# Patient Record
Sex: Female | Born: 1965 | Hispanic: No
Health system: Southern US, Community
[De-identification: ages and names within clinical notes are randomized; demographics above are authoritative.]

---

## 2006-12-31 ENCOUNTER — Other Ambulatory Visit: Admission: RE | Admit: 2006-12-31 | Discharge: 2006-12-31 | Payer: Self-pay | Admitting: Gynecology

## 2007-10-04 ENCOUNTER — Other Ambulatory Visit: Admission: RE | Admit: 2007-10-04 | Discharge: 2007-10-04 | Payer: Self-pay | Admitting: Family Medicine

## 2014-10-23 ENCOUNTER — Other Ambulatory Visit (HOSPITAL_COMMUNITY)
Admission: RE | Admit: 2014-10-23 | Discharge: 2014-10-23 | Disposition: A | Discharge status: Home / Self Care | Payer: BLUE CROSS/BLUE SHIELD | Source: Ambulatory Visit | Attending: Family Medicine | Admitting: Family Medicine

## 2014-10-23 ENCOUNTER — Other Ambulatory Visit: Payer: Self-pay | Admitting: Family Medicine

## 2014-10-23 DIAGNOSIS — Z124 Encounter for screening for malignant neoplasm of cervix: Secondary | ICD-10-CM | POA: Diagnosis not present

## 2014-10-27 LAB — CYTOLOGY - PAP

## 2019-01-29 ENCOUNTER — Other Ambulatory Visit: Payer: Self-pay | Admitting: Physician Assistant

## 2019-01-29 DIAGNOSIS — N6310 Unspecified lump in the right breast, unspecified quadrant: Secondary | ICD-10-CM

## 2019-01-29 DIAGNOSIS — N63 Unspecified lump in unspecified breast: Secondary | ICD-10-CM | POA: Diagnosis not present

## 2019-01-30 ENCOUNTER — Ambulatory Visit
Admission: RE | Admit: 2019-01-30 | Discharge: 2019-01-30 | Disposition: A | Discharge status: Home / Self Care | Payer: Managed Care, Other (non HMO) | Source: Ambulatory Visit | Attending: Physician Assistant | Admitting: Physician Assistant

## 2019-01-30 ENCOUNTER — Other Ambulatory Visit: Payer: Self-pay

## 2019-01-30 ENCOUNTER — Ambulatory Visit
Admission: RE | Admit: 2019-01-30 | Discharge: 2019-01-30 | Disposition: A | Discharge status: Home / Self Care | Payer: BLUE CROSS/BLUE SHIELD | Source: Ambulatory Visit | Attending: Physician Assistant | Admitting: Physician Assistant

## 2019-01-30 ENCOUNTER — Other Ambulatory Visit: Payer: Self-pay | Admitting: Physician Assistant

## 2019-01-30 DIAGNOSIS — R922 Inconclusive mammogram: Secondary | ICD-10-CM | POA: Diagnosis not present

## 2019-01-30 DIAGNOSIS — N6312 Unspecified lump in the right breast, upper inner quadrant: Secondary | ICD-10-CM | POA: Diagnosis not present

## 2019-01-30 DIAGNOSIS — N6311 Unspecified lump in the right breast, upper outer quadrant: Secondary | ICD-10-CM | POA: Diagnosis not present

## 2019-01-30 DIAGNOSIS — N6310 Unspecified lump in the right breast, unspecified quadrant: Secondary | ICD-10-CM

## 2019-01-30 DIAGNOSIS — N6001 Solitary cyst of right breast: Secondary | ICD-10-CM | POA: Diagnosis not present

## 2019-03-12 ENCOUNTER — Other Ambulatory Visit (HOSPITAL_COMMUNITY)
Admission: RE | Admit: 2019-03-12 | Discharge: 2019-03-12 | Disposition: A | Discharge status: Home / Self Care | Payer: BC Managed Care – PPO | Source: Ambulatory Visit | Attending: Physician Assistant | Admitting: Physician Assistant

## 2019-03-12 ENCOUNTER — Other Ambulatory Visit: Payer: Self-pay | Admitting: Physician Assistant

## 2019-03-12 DIAGNOSIS — Z1211 Encounter for screening for malignant neoplasm of colon: Secondary | ICD-10-CM | POA: Diagnosis not present

## 2019-03-12 DIAGNOSIS — Z724 Inappropriate diet and eating habits: Secondary | ICD-10-CM | POA: Diagnosis not present

## 2019-03-12 DIAGNOSIS — Z13220 Encounter for screening for lipoid disorders: Secondary | ICD-10-CM | POA: Diagnosis not present

## 2019-03-12 DIAGNOSIS — R102 Pelvic and perineal pain: Secondary | ICD-10-CM | POA: Diagnosis not present

## 2019-03-12 DIAGNOSIS — Z124 Encounter for screening for malignant neoplasm of cervix: Secondary | ICD-10-CM | POA: Insufficient documentation

## 2019-03-12 DIAGNOSIS — N930 Postcoital and contact bleeding: Secondary | ICD-10-CM | POA: Diagnosis not present

## 2019-03-12 DIAGNOSIS — Z0000 Encounter for general adult medical examination without abnormal findings: Secondary | ICD-10-CM | POA: Diagnosis not present

## 2019-03-12 DIAGNOSIS — R1900 Intra-abdominal and pelvic swelling, mass and lump, unspecified site: Secondary | ICD-10-CM | POA: Diagnosis not present

## 2019-03-12 DIAGNOSIS — Z23 Encounter for immunization: Secondary | ICD-10-CM | POA: Diagnosis not present

## 2019-03-14 ENCOUNTER — Other Ambulatory Visit: Payer: Self-pay | Admitting: Physician Assistant

## 2019-03-14 DIAGNOSIS — R102 Pelvic and perineal pain: Secondary | ICD-10-CM

## 2019-03-14 DIAGNOSIS — R1900 Intra-abdominal and pelvic swelling, mass and lump, unspecified site: Secondary | ICD-10-CM

## 2019-03-14 LAB — CYTOLOGY - PAP
Chlamydia: NEGATIVE
Diagnosis: NEGATIVE
HPV: NOT DETECTED
Neisseria Gonorrhea: NEGATIVE
Trichomonas: NEGATIVE

## 2019-03-31 ENCOUNTER — Ambulatory Visit
Admission: RE | Admit: 2019-03-31 | Discharge: 2019-03-31 | Disposition: A | Discharge status: Home / Self Care | Payer: BC Managed Care – PPO | Source: Ambulatory Visit | Attending: Physician Assistant | Admitting: Physician Assistant

## 2019-03-31 DIAGNOSIS — R102 Pelvic and perineal pain: Secondary | ICD-10-CM

## 2019-03-31 DIAGNOSIS — R1909 Other intra-abdominal and pelvic swelling, mass and lump: Secondary | ICD-10-CM | POA: Diagnosis not present

## 2019-03-31 DIAGNOSIS — R1900 Intra-abdominal and pelvic swelling, mass and lump, unspecified site: Secondary | ICD-10-CM

## 2019-04-23 ENCOUNTER — Ambulatory Visit: Payer: Managed Care, Other (non HMO) | Admitting: Registered"

## 2019-12-16 DIAGNOSIS — M7660 Achilles tendinitis, unspecified leg: Secondary | ICD-10-CM | POA: Diagnosis not present

## 2020-01-09 ENCOUNTER — Ambulatory Visit (INDEPENDENT_AMBULATORY_CARE_PROVIDER_SITE_OTHER): Payer: BC Managed Care – PPO | Admitting: Family Medicine

## 2020-01-09 ENCOUNTER — Ambulatory Visit: Payer: Self-pay

## 2020-01-09 ENCOUNTER — Other Ambulatory Visit: Payer: Self-pay

## 2020-01-09 VITALS — BP 130/80 | HR 60 | Ht 63.0 in | Wt 146.4 lb

## 2020-01-09 DIAGNOSIS — E65 Localized adiposity: Secondary | ICD-10-CM | POA: Diagnosis not present

## 2020-01-09 DIAGNOSIS — S86311A Strain of muscle(s) and tendon(s) of peroneal muscle group at lower leg level, right leg, initial encounter: Secondary | ICD-10-CM | POA: Diagnosis not present

## 2020-01-09 DIAGNOSIS — M79672 Pain in left foot: Secondary | ICD-10-CM | POA: Diagnosis not present

## 2020-01-09 DIAGNOSIS — S86319A Strain of muscle(s) and tendon(s) of peroneal muscle group at lower leg level, unspecified leg, initial encounter: Secondary | ICD-10-CM | POA: Insufficient documentation

## 2020-01-09 MED ORDER — NITROGLYCERIN 0.2 MG/HR TD PT24: MEDICATED_PATCH | TRANSDERMAL | 1 refills | Status: AC

## 2020-01-09 NOTE — Patient Instructions (Addendum)
Thank you for coming in today. Use gel heel pads or insoles in your running shoes for normal activity.  Use the compression sleeve.  Do the exercises.  Go from up to down slowly. It should take 3-4 seconds.  10-30 reps 2-3x daily.   I recommend you obtained a compression sleeve to help with your joint problems. There are many options on the market however I recommend obtaining a Full Ankle  Body Helix compression sleeve.  You can find information (including how to appropriate measure yourself for sizing) can be found at www.[employer].ch.  Many of these products are health savings account (HSA) eligible.   You can use the compression sleeve at any time throughout the day but is most important to use while being active as well as for 2 hours post-activity.   It is appropriate to ice following activity with the compression sleeve in place.  Nitroglycerin Protocol   Apply 1/4 nitroglycerin patch to affected area daily.  Change position of patch within the affected area every 24 hours.  You may experience a headache during the first 1-2 weeks of using the patch, these should subside.  If you experience headaches after beginning nitroglycerin patch treatment, you may take your preferred over the counter pain reliever.  Another side effect of the nitroglycerin patch is skin irritation or rash related to patch adhesive.  Please notify our office if you develop more severe headaches or rash, and stop the patch.  Tendon healing with nitroglycerin patch may require 12 to 24 weeks depending on the extent of injury.  Men should not use if taking Viagra, Cialis, or Levitra.   Do not use if you have migraines or rosacea.    Peroneal Tendinopathy Rehab Ask your health care provider which exercises are safe for you. Do exercises exactly as told by your health care provider and adjust them as directed. It is normal to feel mild stretching, pulling, tightness, or discomfort as you do these  exercises. Stop right away if you feel sudden pain or your pain gets worse. Do not begin these exercises until told by your health care provider. Stretching and range-of-motion exercises These exercises warm up your muscles and joints and improve the movement and flexibility of your ankle. These exercises also help to relieve pain and stiffness. Gastroc and soleus stretch, standing This is an exercise in which you stand on a step and use your body weight to stretch your calf muscles. To do this exercise: 1. Stand on the edge of a step on the ball of your left / right foot. The ball of your foot is on the walking surface, right under your toes. 2. Keep your other foot firmly on the same step. 3. Hold on to the wall, a railing, or a chair for balance. 4. Slowly lift your other foot, allowing your body weight to press your left / right heel down over the edge of the step. You should feel a stretch in your left / right calf (gastrocnemius and soleus). 5. Hold this position for __________ seconds. 6. Return both feet to the step. 7. Repeat this exercise with a slight bend in your left / right knee. Repeat __________ times with your left / right knee straight and __________ times with your left / right knee bent. Complete this exercise __________ times a day. Strengthening exercises These exercises build strength and endurance in your foot and ankle. Endurance is the ability to use your muscles for a long time, even after they  get tired. Ankle dorsiflexion with band  1. Secure a rubber exercise band or tube to an object, such as a table leg, that will not move when the band is pulled. 2. Secure the other end of the band around your left / right foot. 3. Sit on the floor, facing the object with your left / right leg extended. The band or tube should be slightly tense when your foot is relaxed. 4. Slowly flex your left / right ankle and toes to bring your foot toward you (dorsiflexion). 5. Hold this  position for __________ seconds. 6. Let the band or tube slowly pull your foot back to the starting position. Repeat __________ times. Complete this exercise __________ times a day. Ankle eversion 1. Sit on the floor with your legs straight out in front of you. 2. Loop a rubber exercise band or tube around the ball of your left / right foot. The ball of your foot is on the walking surface, right under your toes. 3. Hold the ends of the band in your hands, or secure the band to a stable object. The band or tube should be slightly tense when your foot is relaxed. 4. Slowly push your foot outward, away from your other leg (eversion). 5. Hold this position for __________ seconds. 6. Slowly return your foot to the starting position. Repeat __________ times. Complete this exercise __________ times a day. Plantar flexion, standing This exercise is sometimes called standing heel raise. 1. Stand with your feet shoulder-width apart. 2. Place your hands on a wall or table to steady yourself as needed, but try not to use it for support. 3. Keep your weight spread evenly over the width of your feet while you slowly rise up on your toes (plantar flexion). If told by your health care provider: ? Shift your weight toward your left / right leg until you feel challenged. ? Stand on your left / right leg only. 4. Hold this position for __________ seconds. Repeat __________ times. Complete this exercise __________ times a day. Single leg stand 1. Without shoes, stand near a railing or in a doorway. You may hold on to the railing or door frame as needed. 2. Stand on your left / right foot. Keep your big toe down on the floor and try to keep your arch lifted. ? Do not roll to the outside of your foot. ? If this exercise is too easy, you can try it with your eyes closed or while standing on a pillow. 3. Hold this position for __________ seconds. Repeat __________ times. Complete this exercise __________ times a  day. This information is not intended to replace advice given to you by your health care provider. Make sure you discuss any questions you have with your health care provider. Document Revised: 10/22/2018 Document Reviewed: 10/22/2018 Elsevier Patient Education  [place].

## 2020-01-09 NOTE — Progress Notes (Signed)
    Subjective:    CC: L Achille's tendon pain  I, Suzanne Lee, LAT, ATC, am serving as scribe for Dr. Clementeen Graham.  HPI: Pt is a 54 y/o female presenting w/ c/o lateral ankle tendon pain x approximately one month w/ no known MOI.  Pt states that she is an avid runner and has not been running due to the pain.  She states that she does recall stepping into a hole in her yard and that may be related.  She also notes pain at the plantar aspect of her calcaneus near the midpoint between Achilles tendon and plantar fascia.  Radiating pain: from the proximal calcaneus to the peroneal tendon Achille's tendon swelling: slightly between L lateral malleolus and Achille's Aggravating factors: walking/weight-bearing; L ankle inversion Treatments tried: RICE; lidocaine patch  Pertinent review of Systems: No fevers or chills  Relevant historical information: No migraines   Objective:    Vitals:   01/09/20 0829  BP: 130/80  Pulse: 60  SpO2: 99%   General: Well Developed, well nourished, and in no acute distress.   MSK: Left ankle and foot normal-appearing Normal motion. Palpable click/snap at lateral malleolus with ankle motion occasionally. Mildly tender palpation of posterior aspect of lateral malleolus.  Mildly tender palpation posterior plantar calcaneus. Stable ligamentous exam. Intact strength. Pulses cap refill and sensation are intact distally.  Lab and Radiology Results  Diagnostic Limited MSK Ultrasound of: Left lateral ankle and plantar calcaneus Achilles tendon intact normal-appearing Peroneal tendons visible.  With dynamic motion peroneal longus compresses and flattens the peroneal brevis tendon indicating probable split tear peroneal brevis tendon. Tendon fascicles appear to be intact without significant transverse tearing. Mild hypoechoic fluid present in tendon sheath. Plantar calcaneus visible.  Hypoechoic fluid tracking interspaced in fat pad compressible  indicating fat pad edema. Bony cortex and plantar fashion normal-appearing Impression: Linear split tear peroneal brevis tendon and heel pad inflammation    Impression and Recommendations:    Assessment and Plan: 55 y.o. female with  Left lateral ankle pain.  Probable linear split tear peroneal brevis tendon based on dynamic imaging.  Plan for nitroglycerin patches home exercise program and compressive ankle sleeve.  Recheck back in about 1 month especially if not improving.  Next steps would be either injection or MRI.Marland Kitchen  Plantar calcaneal pain: Fat pad inflammation.  Plan for cushioning and relative rest.  Recheck in a month.   Orders Placed This Encounter  Procedures  . [place] LIMITED JOINT SPACE STRUCTURES LOW LEFT(NO LINKED CHARGES)    Order Specific Question:   Reason for Exam (SYMPTOM  OR DIAGNOSIS REQUIRED)    Answer:   left heel pain    Order Specific Question:   Preferred imaging location?    Answer:   [facility] ordered this encounter  Medications  . nitroGLYCERIN (NITRODUR - DOSED IN MG/24 HR) 0.2 mg/hr patch    Sig: Apply 1/4 patch daily to tendon for tendonitis.    Dispense:  30 patch    Refill:  1    Discussed warning signs or symptoms. Please see discharge instructions. Patient expresses understanding.   The above documentation has been reviewed and is accurate and complete Clementeen Graham, M.D.

## 2020-02-16 ENCOUNTER — Other Ambulatory Visit: Payer: Self-pay

## 2020-02-16 ENCOUNTER — Ambulatory Visit (INDEPENDENT_AMBULATORY_CARE_PROVIDER_SITE_OTHER): Payer: BC Managed Care – PPO | Admitting: Family Medicine

## 2020-02-16 VITALS — BP 140/82 | HR 72 | Ht 63.0 in | Wt 146.8 lb

## 2020-02-16 DIAGNOSIS — S86312D Strain of muscle(s) and tendon(s) of peroneal muscle group at lower leg level, left leg, subsequent encounter: Secondary | ICD-10-CM | POA: Diagnosis not present

## 2020-02-16 NOTE — Progress Notes (Signed)
   I, Christoper Fabian, LAT, ATC, am serving as scribe for Dr. Clementeen Graham.  Suzanne Lee is a 54 y.o. female who presents to [facility] today for f/u of L Achille's pain.  She was last seen by Dr. Denyse Amass on 01/09/20 and was prescribed Nitro patches and was shown Alfredson's exercises by Dr. Denyse Amass.  She was also advised to wear a compression sleeve and to use cushioning inserts in her shoes.  Since her last visit, pt reports that her pain has become more intermittent and notes decreased popping in her ankle / Achille's.  She states that she has not run since her last visit.  She has been using the nitroglycerin patches and doing her HEP.   Pertinent review of systems: No fevers or chills  Relevant historical information: Reasonably healthy overall.   Exam:  BP 140/82 (BP Location: Right Arm, Patient Position: Sitting, Cuff Size: Normal)   Pulse 72   Ht 5\' 3"  (1.6 m)   Wt 146 lb 12.8 oz (66.6 kg)   SpO2 97%   BMI 26.00 kg/m  General: Well Developed, well nourished, and in no acute distress.   MSK: Left ankle normal-appearing nontender normal motion normal strength.       Assessment and Plan: 54 y.o. female with left lateral ankle pain.  Patient had tear of peroneal brevis tendon on ultrasound at last visit.  Clinically she has improved considerably with home exercise program and nitroglycerin patch protocol.  Recommend continuing both for another 2 months.  Recommend starting return to activity program at about 50% preinjury workload and advancing 10 %/week.  Recheck back with me as needed.   Discussed warning signs or symptoms. Please see discharge instructions. Patient expresses understanding.   The above documentation has been reviewed and is accurate and complete 53, M.D.  Total encounter time 20 minutes including charting time date of service.

## 2020-02-16 NOTE — Patient Instructions (Signed)
Thank you for coming in today. Plan for exercises.  Continue nitro-patches.  Advance activity.  Start at 50% pre-injury workload and advance by about 10% per week.  Recheck with me as needed.

## 2020-05-25 IMAGING — MG DIGITAL DIAGNOSTIC BILATERAL MAMMOGRAM WITH TOMO AND CAD
5 of 10 series · 5 of 30 positions shown · non-contrast
Comparison: None.
COMPARISON: None.

Addendum:
CLINICAL DATA: Patient presents with a palpable lump in the upper
right breast, which is also tender.

EXAM:
DIGITAL DIAGNOSTIC BILATERAL MAMMOGRAM WITH CAD AND TOMO
ULTRASOUND LEFT BREAST

[R MLO synth-2D (1 of 2)]
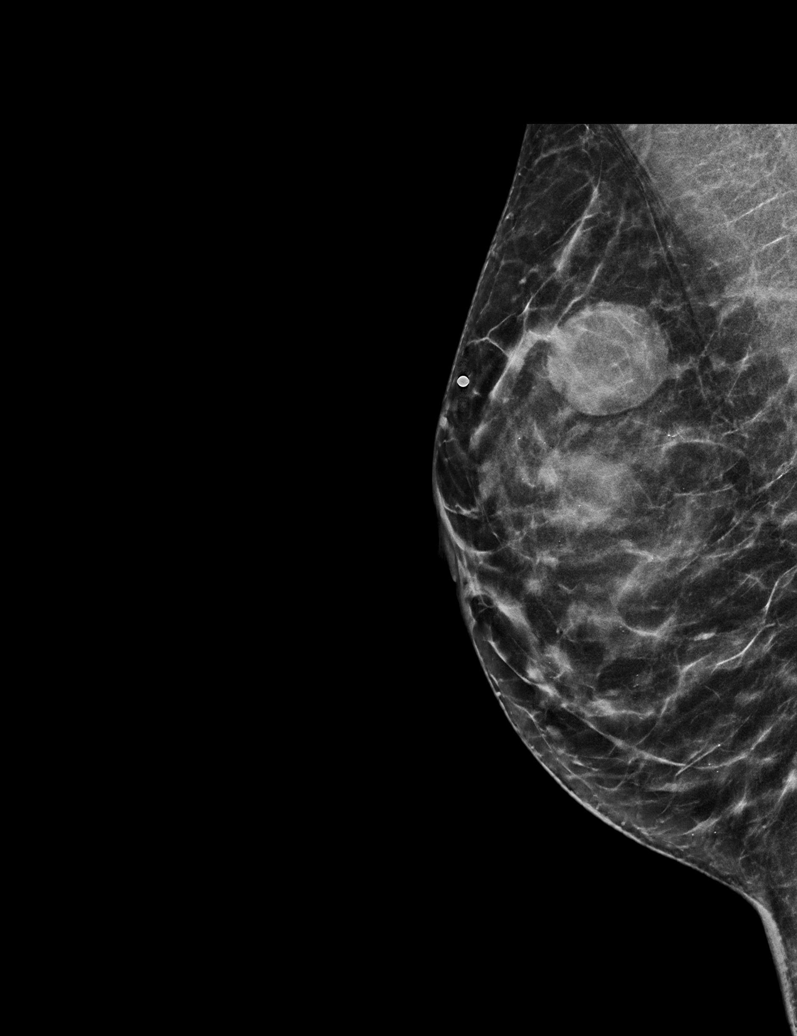

[R CC synth-2D]
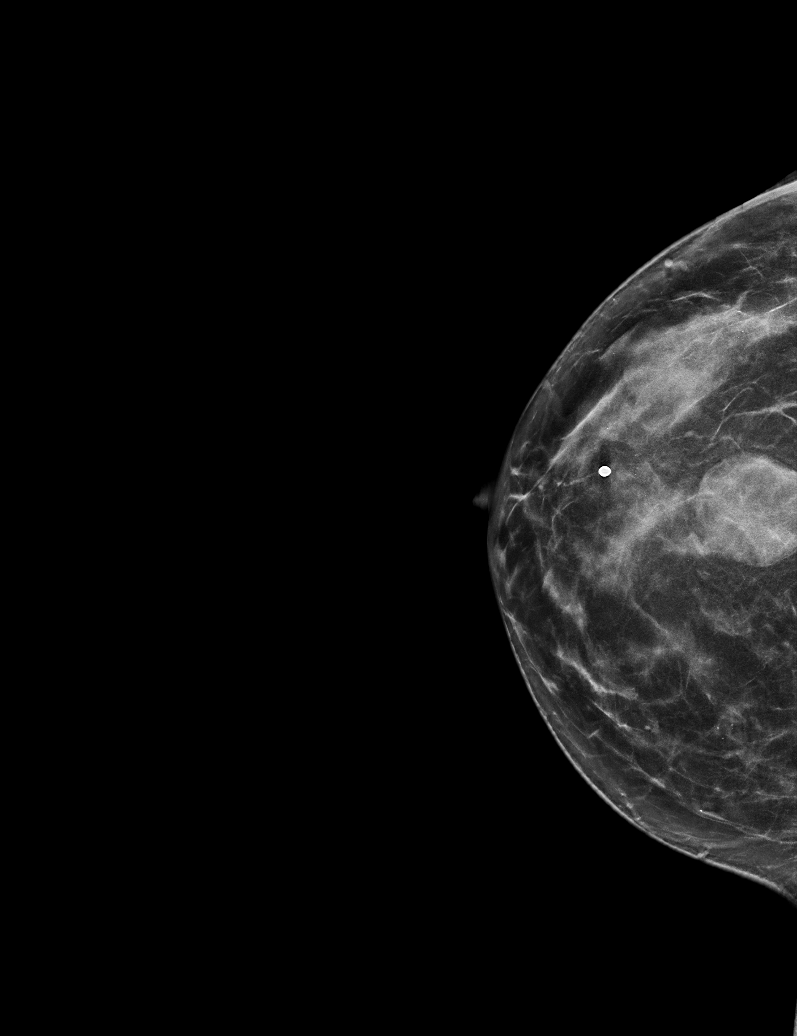

[R MLO synth-2D (2 of 2)]
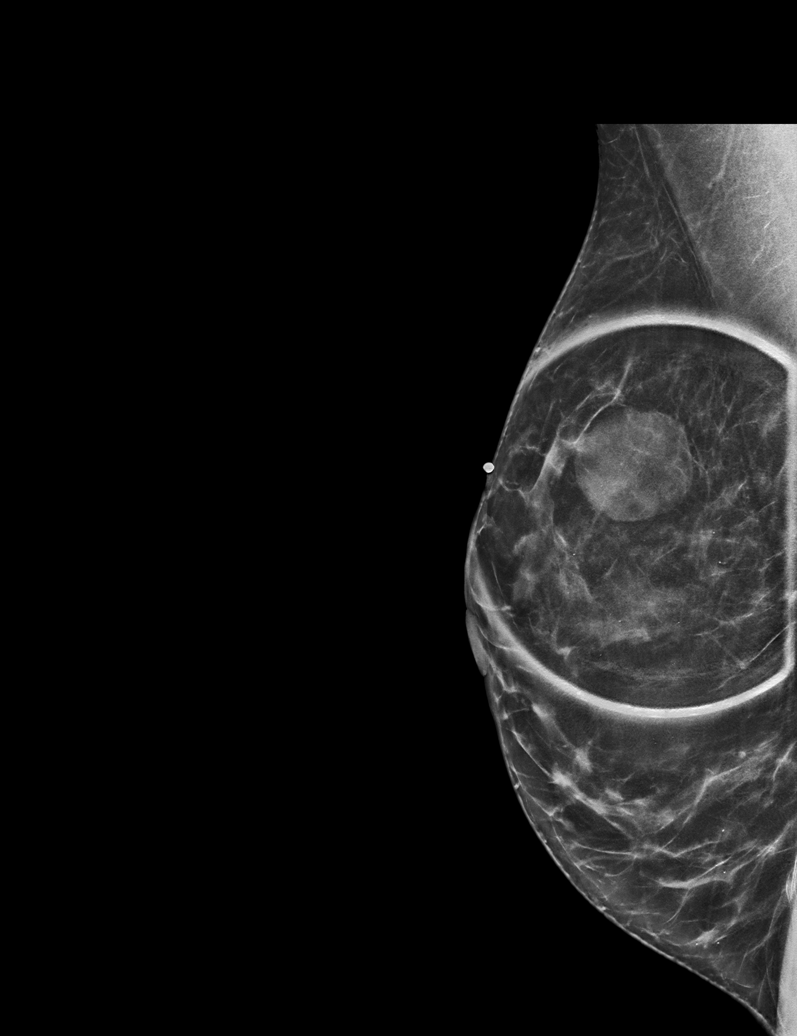

[L MLO synth-2D]
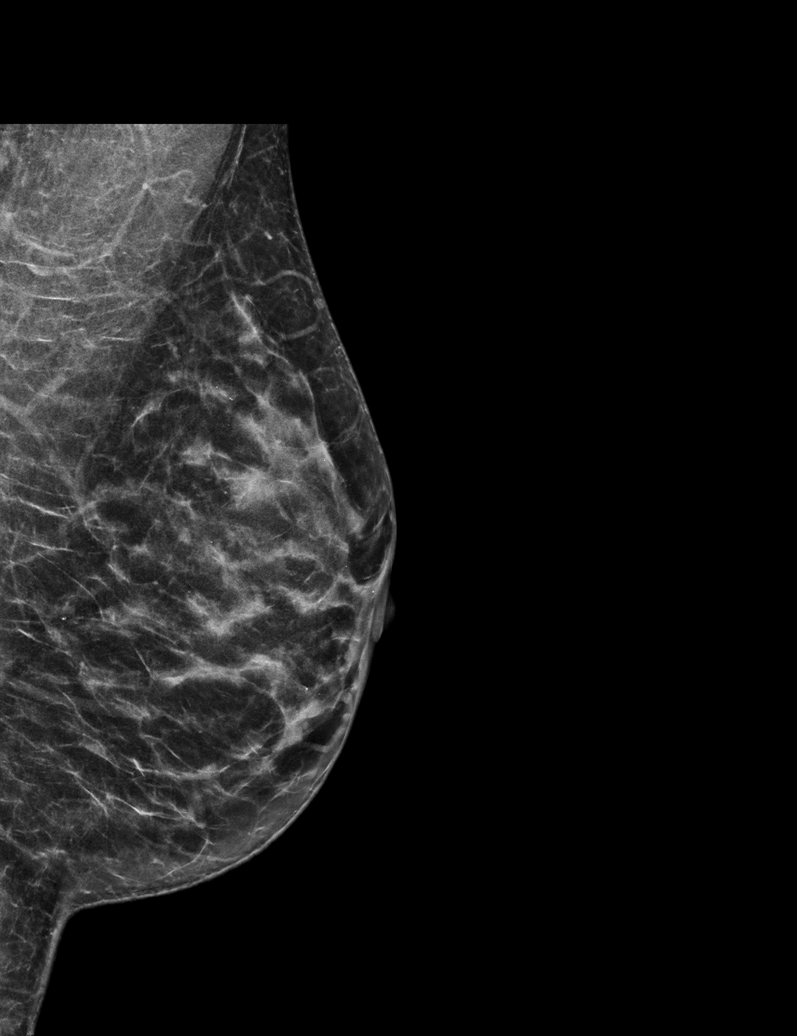

[L CC synth-2D]
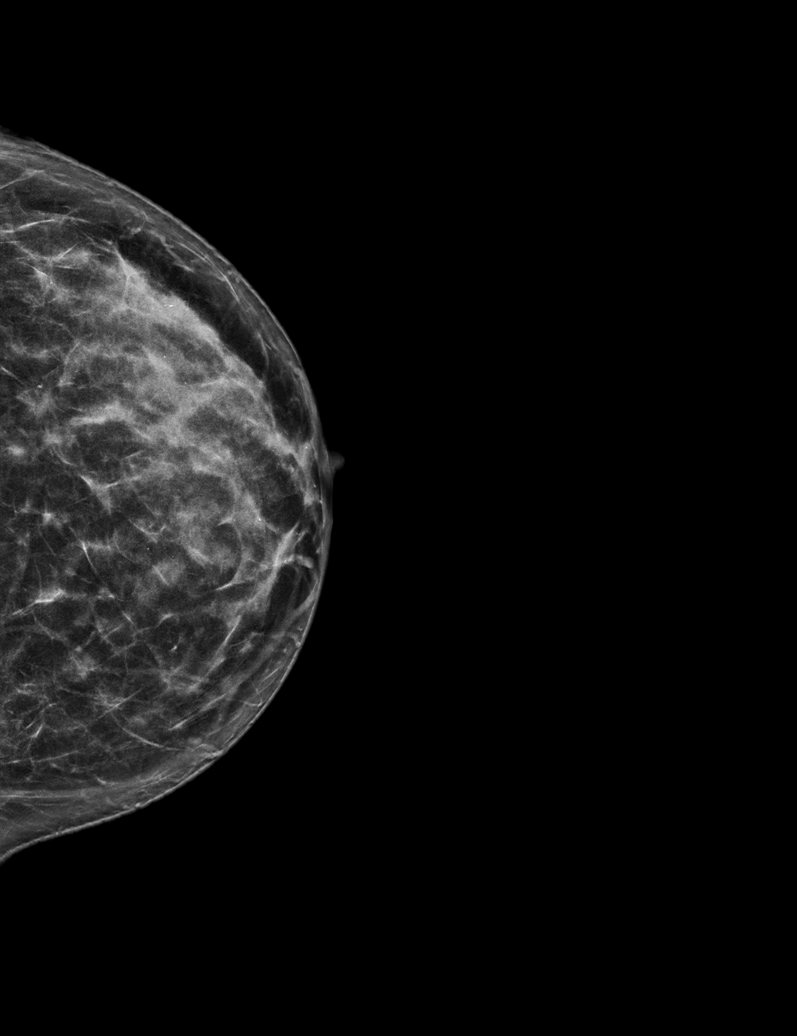

[5 of 30 positions shown; findings below may reference images not displayed]

ACR Breast Density Category c: The breast tissue is heterogeneously
dense, which may obscure small masses.
FINDINGS: In the upper right breast, there is a circumscribed oval mass
corresponding to the palpable abnormality. There is a partly
obscured smaller oval circumscribed mass in the lateral right
breast.

There are no other masses in either breast, no areas of
architectural distortion and no suspicious calcifications.

Mammographic images were processed with CAD.

On physical exam, there is a firm tender mobile smooth mass in the
12 o'clock position of the right breast. No other palpable masses.

Targeted ultrasound is performed, showing a round circumscribed
hypoechoic mass in the right breast at 12 o'clock, 2 cm the nipple,
measuring 2 cm in diameter. There is no internal blood flow on color
Doppler analysis. There is increased through transmission of the
sound beam. In the lateral right breast, 9 o'clock 3 cm the nipple,
there is a simple oval cyst measuring 1.4 x 0.9 x 1.3 cm,
corresponding to the second mass seen mammographically.
IMPRESSION: 1. Indeterminate mass at 12 o'clock in the right breast, most likely
a cyst complicated by hemorrhage. Aspiration is recommended.
2. Benign lateral right breast cyst.

RECOMMENDATION:
1. Ultrasound-guided cyst aspiration of the probable complicated
cyst at 12 o'clock in the right breast, which corresponds to the
palpable abnormality. If this does not aspirate, ultrasound-guided
core needle biopsy would be indicated. If biopsy is performed, the
patient will need right axillary ultrasound to assess lymph nodes.

I have discussed the findings and recommendations with the patient.
Results were also provided in writing at the conclusion of the
visit. If applicable, a reminder letter will be sent to the patient
regarding the next appointment.

BI-RADS CATEGORY  4: Suspicious.

ADDENDUM:
The probable complicated cyst at 12 o'clock was aspirated following
the patient's diagnostic imaging. The cyst completely collapsed
yielding greenish yellow fluid. This confirms the mass as a
complicated cyst and excludes malignancy.

Revised Impression: Benign right breast cysts.

Revised recommendation: Screening mammogram in one
year.(Code:Z9-H-PRM)

Revised BI-RADS category: 2: Benign.

*** End of Addendum ***
ACR Breast Density Category c: The breast tissue is heterogeneously
dense, which may obscure small masses.
FINDINGS: In the upper right breast, there is a circumscribed oval mass
corresponding to the palpable abnormality. There is a partly
obscured smaller oval circumscribed mass in the lateral right
breast.

There are no other masses in either breast, no areas of
architectural distortion and no suspicious calcifications.

Mammographic images were processed with CAD.

On physical exam, there is a firm tender mobile smooth mass in the
12 o'clock position of the right breast. No other palpable masses.

Targeted ultrasound is performed, showing a round circumscribed
hypoechoic mass in the right breast at 12 o'clock, 2 cm the nipple,
measuring 2 cm in diameter. There is no internal blood flow on color
Doppler analysis. There is increased through transmission of the
sound beam. In the lateral right breast, 9 o'clock 3 cm the nipple,
there is a simple oval cyst measuring 1.4 x 0.9 x 1.3 cm,
corresponding to the second mass seen mammographically.
IMPRESSION: 1. Indeterminate mass at 12 o'clock in the right breast, most likely
a cyst complicated by hemorrhage. Aspiration is recommended.
2. Benign lateral right breast cyst.

RECOMMENDATION:
1. Ultrasound-guided cyst aspiration of the probable complicated
cyst at 12 o'clock in the right breast, which corresponds to the
palpable abnormality. If this does not aspirate, ultrasound-guided
core needle biopsy would be indicated. If biopsy is performed, the
patient will need right axillary ultrasound to assess lymph nodes.

I have discussed the findings and recommendations with the patient.
Results were also provided in writing at the conclusion of the
visit. If applicable, a reminder letter will be sent to the patient
regarding the next appointment.

BI-RADS CATEGORY  4: Suspicious.
